# Patient Record
Sex: Female | Born: 1995 | Race: White | Hispanic: No | Marital: Single | Smoking: Current every day smoker
Health system: Southern US, Community
[De-identification: ages and names within clinical notes are randomized; demographics above are authoritative.]

---
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
## 2016-06-12 ENCOUNTER — Ambulatory Visit (HOSPITAL_COMMUNITY): Payer: Self-pay

## 2020-02-03 ENCOUNTER — Other Ambulatory Visit: Payer: Self-pay

## 2020-02-03 ENCOUNTER — Ambulatory Visit
Admission: EM | Admit: 2020-02-03 | Discharge: 2020-02-03 | Disposition: A | Discharge status: Home / Self Care | Payer: 59

## 2020-02-03 DIAGNOSIS — R0981 Nasal congestion: Secondary | ICD-10-CM | POA: Diagnosis not present

## 2020-02-03 DIAGNOSIS — Z1152 Encounter for screening for COVID-19: Secondary | ICD-10-CM

## 2020-02-03 DIAGNOSIS — R059 Cough, unspecified: Secondary | ICD-10-CM

## 2020-02-03 DIAGNOSIS — R05 Cough: Secondary | ICD-10-CM

## 2020-02-03 NOTE — Discharge Instructions (Signed)
COVID PCR testing ordered. I would like you to quarantine until testing results. You can take over the counter flonase/nasacort to help with nasal congestion/drainage. Tylenol/motrin for pain and fever. Keep hydrated, urine should be clear to pale yellow in color. If experiencing shortness of breath, trouble breathing, go to the emergency department for further evaluation needed.  

## 2020-02-03 NOTE — ED Triage Notes (Signed)
Pt states received her 2nd covid vaccine on Tuesday and Wednesday developed a cough, nasal congestion, headache, ear fullness, sore throat, and fatigue.

## 2020-02-03 NOTE — ED Provider Notes (Signed)
[facility]    CSN: [id number] Arrival date & time: 02/03/20  1316      History   Chief Complaint Chief Complaint  Patient presents with  . Cough    HPI Terri Ramos is a 24 y.o. female.   24 year old female comes in for 2 day of URI symptoms. Cough, nasal congestion, headache, ear fullness, sore throat, fatigue. Denies fever, chills, body aches. Denies abdominal pain, nausea, vomiting, diarrhea. Denies shortness of breath, loss of taste/smell. otc medications without relief. States symptoms started a day after her 2nd COVID vaccine.      History reviewed. No pertinent past medical history.  There are no problems to display for this patient.   History reviewed. No pertinent surgical history.  OB History   No obstetric history on file.      Home Medications    Prior to Admission medications   Medication Sig Start Date End Date Taking? Authorizing Provider  cetirizine (ZYRTEC) 10 MG tablet Take 10 mg by mouth daily.   Yes [provider]  ibuprofen (ADVIL,MOTRIN) 800 MG tablet Take 1 tablet (800 mg total) by mouth 3 (three) times daily. 09/20/14   Palumbo, April, MD    Family History History reviewed. No pertinent family history.  Social History Social History   Tobacco Use  . Smoking status: Current Every Day Smoker    Types: Cigarettes  . Smokeless tobacco: Never Used  Substance Use Topics  . Alcohol use: Yes    Comment: occ  . Drug use: No     Allergies   Patient has no known allergies.   Review of Systems Review of Systems  Reason unable to perform ROS: See HPI as above.     Physical Exam Triage Vital Signs ED Triage Vitals  Enc Vitals Group     BP 02/03/20 1431 123/82     Pulse Rate 02/03/20 1431 (!) 59     Resp 02/03/20 1431 16     Temp 02/03/20 1431 98.6 F (37 C)     Temp Source 02/03/20 1431 Oral     SpO2 02/03/20 1431 98 %     Weight --      Height --      Head Circumference --      Peak Flow --       Pain Score 02/03/20 1452 4     Pain Loc --      Pain Edu? --      Excl. in GC? --    No data found.  Updated Vital Signs BP 123/82 (BP Location: Left Arm)   Pulse (!) 59   Temp 98.6 F (37 C) (Oral)   Resp 16   SpO2 98%   Physical Exam Constitutional:      General: She is not in acute distress.    Appearance: Normal appearance. She is well-developed. She is not ill-appearing, toxic-appearing or diaphoretic.  HENT:     Head: Normocephalic and atraumatic.     Right Ear: Tympanic membrane, ear canal and external ear normal. Tympanic membrane is not erythematous or bulging.     Left Ear: Tympanic membrane, ear canal and external ear normal. Tympanic membrane is not erythematous or bulging.     Nose:     Right Sinus: Maxillary sinus tenderness present. No frontal sinus tenderness.     Left Sinus: Maxillary sinus tenderness present. No frontal sinus tenderness.     Mouth/Throat:     Mouth: Mucous membranes are moist.  Pharynx: Oropharynx is clear. Uvula midline.  Eyes:     Conjunctiva/sclera: Conjunctivae normal.     Pupils: Pupils are equal, round, and reactive to light.  Cardiovascular:     Rate and Rhythm: Normal rate and regular rhythm.  Pulmonary:     Effort: Pulmonary effort is normal. No accessory muscle usage, prolonged expiration, respiratory distress or retractions.     Breath sounds: No decreased air movement or transmitted upper airway sounds. No decreased breath sounds.     Comments: LCTAB Musculoskeletal:     Cervical back: Normal range of motion and neck supple.  Lymphadenopathy:     Cervical: Cervical adenopathy present.  Skin:    General: Skin is warm and dry.  Neurological:     Mental Status: She is alert and oriented to person, place, and time.      UC Treatments / Results  Labs (all labs ordered are listed, but only abnormal results are displayed) Labs Reviewed  NOVEL CORONAVIRUS, NAA    EKG   Radiology No results  found.  Procedures Procedures (including critical care time)  Medications Ordered in UC Medications - No data to display  Initial Impression / Assessment and Plan / UC Course  I have reviewed the triage vital signs and the nursing notes.  Pertinent labs & imaging results that were available during my care of the patient were reviewed by me and considered in my medical decision making (see chart for details).    COVID PCR test ordered. Patient to quarantine until testing results return. No alarming signs on exam. LCTAB. Symptomatic treatment discussed.  Push fluids.  Return precautions given.  Patient expresses understanding and agrees to plan.  Final Clinical Impressions(s) / UC Diagnoses   Final diagnoses:  Nasal congestion  Cough  Encounter for screening for COVID-19    ED Prescriptions    None     PDMP not reviewed this encounter.   Belinda Fisher, PA-C 02/03/20 1526

## 2020-02-04 LAB — NOVEL CORONAVIRUS, NAA: SARS-CoV-2, NAA: DETECTED — AB

## 2020-02-04 LAB — SARS-COV-2, NAA 2 DAY TAT

## 2021-02-16 ENCOUNTER — Ambulatory Visit
Admission: EM | Admit: 2021-02-16 | Discharge: 2021-02-16 | Disposition: A | Discharge status: Home / Self Care | Payer: 59 | Attending: Urgent Care | Admitting: Urgent Care

## 2021-02-16 ENCOUNTER — Encounter: Payer: Self-pay | Admitting: Emergency Medicine

## 2021-02-16 DIAGNOSIS — Z1152 Encounter for screening for COVID-19: Secondary | ICD-10-CM | POA: Insufficient documentation

## 2021-02-16 DIAGNOSIS — R070 Pain in throat: Secondary | ICD-10-CM | POA: Insufficient documentation

## 2021-02-16 DIAGNOSIS — J029 Acute pharyngitis, unspecified: Secondary | ICD-10-CM | POA: Diagnosis not present

## 2021-02-16 LAB — POCT RAPID STREP A (OFFICE): Rapid Strep A Screen: NEGATIVE

## 2021-02-16 MED ORDER — CHLORASEPTIC 1.4 % MT LIQD: 1.0000 | Freq: Three times a day (TID) | OROMUCOSAL | 0 refills | Status: AC | PRN

## 2021-02-16 NOTE — Discharge Instructions (Addendum)
We will notify you of your COVID-19 test results as they arrive and may take between 48-72 hours.  I encourage you to sign up for MyChart if you have not already done so as this can be the easiest way for [place] to communicate results to you online or through a phone app.  Generally, we only contact you if it is a positive COVID result.  In the meantime, if you develop worsening symptoms including fever, chest pain, shortness of breath despite our current treatment plan then please report to the emergency room as this may be a sign of worsening status from possible COVID-19 infection.  Otherwise, we will manage this as a viral syndrome. For sore throat or cough try using a honey-based tea. Use 3 teaspoons of honey with juice squeezed from half lemon. Place shaved pieces of ginger into 1/2-1 cup of water and warm over stove top. Then mix the ingredients and repeat every 4 hours as needed. Please take Tylenol 500mg -650mg  with or without ibuprofen at a dose of 400mg -600mg  every 6 hours for aches and pains, fevers. Hydrate very well with at least 2 liters of water. Eat light meals such as soups to replenish electrolytes and soft fruits, veggies. Start an antihistamine like Zyrtec, Allegra or Claritin for postnasal drainage, sinus congestion.  You can take this together with pseudoephedrine (Sudafed) at a dose of 60 mg 2-3 times a day as needed for the same kind of congestion.

## 2021-02-16 NOTE — ED Triage Notes (Signed)
Pt said x 3 days has been having fevers, chills and sore throat

## 2021-02-16 NOTE — ED Provider Notes (Signed)
[facility]   MRN: [id number] DOB: May 11, 1996  Subjective:   Rosann Gorum is a 25 y.o. female presenting for 3-day history of acute onset fever, chills, throat pain, painful swallowing.  Patient has had a history of strep throat but has not had it in years.  Has been using Tylenol Cold and flu.  Denies cough, chest pain, shortness of breath, wheezing, runny or stuffy nose, ear pain.  Patient is COVID vaccinated but no booster.  No current facility-administered medications for this encounter.  Current Outpatient Medications:    cetirizine (ZYRTEC) 10 MG tablet, Take 10 mg by mouth daily., Disp: , Rfl:    ibuprofen (ADVIL,MOTRIN) 800 MG tablet, Take 1 tablet (800 mg total) by mouth 3 (three) times daily., Disp: 21 tablet, Rfl: 0   No Known Allergies  History reviewed. No pertinent past medical history.   History reviewed. No pertinent surgical history.  History reviewed. No pertinent family history.  Social History   Tobacco Use   Smoking status: Every Day    Types: Cigarettes   Smokeless tobacco: Never  Substance Use Topics   Alcohol use: Yes    Comment: occ   Drug use: No    ROS   Objective:   Vitals: BP 136/79 (BP Location: Right Arm)   Pulse (!) 114   Temp 99.5 F (37.5 C) (Oral)   Resp 16   SpO2 96%   Pulse recheck was 84bpm.   Physical Exam Constitutional:      General: She is not in acute distress.    Appearance: She is well-developed. She is not ill-appearing.  HENT:     Head: Normocephalic and atraumatic.     Right Ear: Tympanic membrane and ear canal normal. No drainage or tenderness. No middle ear effusion. Tympanic membrane is not erythematous.     Left Ear: Tympanic membrane and ear canal normal. No drainage or tenderness.  No middle ear effusion. Tympanic membrane is not erythematous.     Nose: No congestion or rhinorrhea.     Mouth/Throat:     Mouth: Mucous membranes are moist. No oral lesions.     Pharynx: Oropharynx is  clear. No pharyngeal swelling, oropharyngeal exudate, posterior oropharyngeal erythema or uvula swelling.     Tonsils: No tonsillar exudate or tonsillar abscesses.  Eyes:     Extraocular Movements:     Right eye: Normal extraocular motion.     Left eye: Normal extraocular motion.     Conjunctiva/sclera: Conjunctivae normal.     Pupils: Pupils are equal, round, and reactive to light.  Cardiovascular:     Rate and Rhythm: Normal rate.  Pulmonary:     Effort: Pulmonary effort is normal.  Musculoskeletal:     Cervical back: Normal range of motion and neck supple.  Lymphadenopathy:     Cervical: No cervical adenopathy.  Skin:    General: Skin is warm and dry.  Neurological:     General: No focal deficit present.     Mental Status: She is alert and oriented to person, place, and time.  Psychiatric:        Mood and Affect: Mood normal.        Behavior: Behavior normal.    Results for orders placed or performed during the hospital encounter of 02/16/21 (from the past 24 hour(s))  POCT rapid strep A     Status: None   Collection Time: 02/16/21  8:25 AM  Result Value Ref Range   Rapid Strep A Screen Negative Negative  Assessment and Plan :   PDMP not reviewed this encounter.  1. Acute viral pharyngitis   2. Encounter for screening for COVID-19   3. Throat pain     Will manage for viral illness such as viral URI, viral syndrome, viral rhinitis, COVID-19, viral pharyngitis. Counseled patient on nature of COVID-19 including modes of transmission, diagnostic testing, management and supportive care.  Offered scripts for symptomatic relief. COVID 19 and strep culture are pending. Counseled patient on potential for adverse effects with medications prescribed/recommended today, ER and return-to-clinic precautions discussed, patient verbalized understanding.     Wallis Bamberg, [place] 02/16/21 [phone]

## 2021-02-17 LAB — NOVEL CORONAVIRUS, NAA: SARS-CoV-2, NAA: NOT DETECTED

## 2021-02-17 LAB — SARS-COV-2, NAA 2 DAY TAT

## 2021-02-19 LAB — CULTURE, GROUP A STREP (THRC)
